# Patient Record
Sex: Female | Born: 1975 | Race: White | Hispanic: No | Marital: Married
Health system: Southern US, Community
[De-identification: ages and names within clinical notes are randomized; demographics above are authoritative.]

---

## 2018-04-01 ENCOUNTER — Emergency Department (HOSPITAL_COMMUNITY)
Admission: EM | Admit: 2018-04-01 | Discharge: 2018-04-01 | Disposition: A | Discharge status: Home / Self Care | Payer: 59 | Attending: Emergency Medicine | Admitting: Emergency Medicine

## 2018-04-01 ENCOUNTER — Emergency Department (HOSPITAL_COMMUNITY): Payer: 59

## 2018-04-01 ENCOUNTER — Encounter (HOSPITAL_COMMUNITY): Payer: Self-pay

## 2018-04-01 DIAGNOSIS — S82832A Other fracture of upper and lower end of left fibula, initial encounter for closed fracture: Secondary | ICD-10-CM | POA: Diagnosis not present

## 2018-04-01 DIAGNOSIS — Y9301 Activity, walking, marching and hiking: Secondary | ICD-10-CM | POA: Diagnosis not present

## 2018-04-01 DIAGNOSIS — W19XXXA Unspecified fall, initial encounter: Secondary | ICD-10-CM | POA: Diagnosis not present

## 2018-04-01 DIAGNOSIS — Y999 Unspecified external cause status: Secondary | ICD-10-CM | POA: Diagnosis not present

## 2018-04-01 DIAGNOSIS — Y929 Unspecified place or not applicable: Secondary | ICD-10-CM | POA: Insufficient documentation

## 2018-04-01 MED ORDER — HYDROCODONE-ACETAMINOPHEN 5-325 MG PO TABS
1.0000 | ORAL_TABLET | Freq: Once | ORAL | Status: AC
  Administered 2018-04-01: 1 via ORAL
  Filled 2018-04-01: qty 1

## 2018-04-01 MED ORDER — IBUPROFEN 800 MG PO TABS
800.0000 mg | ORAL_TABLET | Freq: Once | ORAL | Status: AC
  Administered 2018-04-01: 800 mg via ORAL
  Filled 2018-04-01: qty 1

## 2018-04-01 MED ORDER — HYDROCODONE-ACETAMINOPHEN 5-325 MG PO TABS: 1.0000 | ORAL_TABLET | ORAL | 0 refills | Status: AC | PRN

## 2018-04-01 NOTE — ED Provider Notes (Signed)
[facility] Provider Note   CSN: [id number] Arrival date & time: 04/01/18  [id number]     History   Chief Complaint Chief Complaint  Patient presents with  . Ankle Pain    HPI Emily Todd is a 43 y.o. female.  43 year old female presents with complaint of left ankle injury.  Patient states that she was walking out side today and slipped and fell.  Patient felt a pop in her left lateral ankle.  Pain in the ankle and the foot, no other injuries, complaints, concerns.     History reviewed. No pertinent past medical history.  There are no active problems to display for this patient.   History reviewed. No pertinent surgical history.   OB History   No obstetric history on file.      Home Medications    Prior to Admission medications   Medication Sig Start Date End Date Taking? Authorizing Provider  HYDROcodone-acetaminophen (NORCO/VICODIN) 5-325 MG tablet Take 1 tablet by mouth every 4 (four) hours as needed for up to 3 days for moderate pain. 04/01/18 04/04/18  Jeannie FendMurphy, Nil Bolser A, PA-C    Family History No family history on file.  Social History Social History   Tobacco Use  . Smoking status: Not on file  Substance Use Topics  . Alcohol use: Not on file  . Drug use: Not on file     Allergies   Patient has no known allergies.   Review of Systems Review of Systems  Constitutional: Negative for fever.  Musculoskeletal: Positive for arthralgias, gait problem and joint swelling.  Skin: Negative for color change, rash and wound.  Allergic/Immunologic: Negative for immunocompromised state.  Neurological: Negative for weakness and numbness.  Hematological: Does not bruise/bleed easily.  Psychiatric/Behavioral: Negative for confusion.  All other systems reviewed and are negative.    Physical Exam Updated Vital Signs BP (!) 150/101 (BP Location: Left Arm)   Pulse 89   Temp 98.2 F (36.8 C) (Oral)   Resp 20   SpO2 98%    Physical Exam Vitals signs and nursing note reviewed.  Constitutional:      General: She is not in acute distress.    Appearance: She is well-developed. She is not diaphoretic.  HENT:     Head: Normocephalic and atraumatic.  Cardiovascular:     Pulses: Normal pulses.  Pulmonary:     Effort: Pulmonary effort is normal.  Musculoskeletal:        General: Swelling, tenderness and signs of injury present.     Left knee: Normal.     Left ankle: She exhibits decreased range of motion and swelling. She exhibits no ecchymosis, no deformity, no laceration and normal pulse. Tenderness. Lateral malleolus and medial malleolus tenderness found. No head of 5th metatarsal and no proximal fibula tenderness found.     Left foot: Decreased range of motion. Normal capillary refill. Tenderness and bony tenderness present. No swelling, crepitus or deformity.       Feet:  Skin:    General: Skin is warm and dry.     Capillary Refill: Capillary refill takes less than 2 seconds.     Findings: No erythema or rash.  Neurological:     Mental Status: She is alert and oriented to person, place, and time.  Psychiatric:        Behavior: Behavior normal.      ED Treatments / Results  Labs (all labs ordered are listed, but only abnormal results are displayed) Labs Reviewed -  No data to display  EKG None  Radiology Dg Ankle Complete Left  Result Date: 04/01/2018 CLINICAL DATA:  Left ankle pain and swelling after fall today. EXAM: LEFT ANKLE COMPLETE - 3+ VIEW COMPARISON:  None. FINDINGS: Mildly displaced fracture is seen involving the distal left fibula with overlying soft tissue swelling. Joint spaces are intact. No other bony abnormality is noted. IMPRESSION: Mildly displaced distal left fibular fracture. Electronically Signed   By: Lupita Raider, M.D.   On: 04/01/2018 09:24   Dg Foot Complete Left  Result Date: 04/01/2018 CLINICAL DATA:  Left foot pain after fall today. EXAM: LEFT FOOT - COMPLETE  3+ VIEW COMPARISON:  None. FINDINGS: Mildly displaced distal fibular fracture is noted. No other fracture or bony abnormality is noted. Joint spaces are intact. No soft tissue abnormality is noted. IMPRESSION: Mildly displaced distal left fibular fracture. Electronically Signed   By: Lupita Raider, M.D.   On: 04/01/2018 09:23    Procedures Procedures (including critical care time)  Medications Ordered in ED Medications  ibuprofen (ADVIL,MOTRIN) tablet 800 mg (800 mg Oral Given 04/01/18 0857)  HYDROcodone-acetaminophen (NORCO/VICODIN) 5-325 MG per tablet 1 tablet (1 tablet Oral Given 04/01/18 0936)     Initial Impression / Assessment and Plan / ED Course  I have reviewed the triage vital signs and the nursing notes.  Pertinent labs & imaging results that were available during my care of the patient were reviewed by me and considered in my medical decision making (see chart for details).  Clinical Course as of Apr 01 1028  Sat Apr 01, 2018  [id number] 43 year old female presents with left ankle injury after a fall today.  On exam patient has swelling to the left ankle, laterally more so than medially with tenderness.  Also tenderness along the left first metatarsal.  X-ray is positive for a distal left fibula fracture, x-ray foot unremarkable.  Patient was placed in a cam walker given crutches to weight-bear as tolerated.  Given prescription for Norco and advised to follow-up with orthopedics.  Patient does not live locally, she was given a CD with her x-rays on it to take to orthopedic follow-up.  Patient plans to contact orthopedics on Monday upon arrival back home in [place].   [LM]    Clinical Course User Index [LM] Jeannie Fend, PA-C   Final Clinical Impressions(s) / ED Diagnoses   Final diagnoses:  Closed fracture of distal end of left fibula, unspecified fracture morphology, initial encounter    ED Discharge Orders         Ordered    HYDROcodone-acetaminophen (NORCO/VICODIN) 5-325 MG  tablet  Every 4 hours PRN     04/01/18 0939           Jeannie Fend, PA-C 04/01/18 1030    Alvira Monday, MD 04/03/18 1606

## 2018-04-01 NOTE — ED Triage Notes (Signed)
Pt presents for evaluation of L ankle pain. Pt tripped this morning while walking out of hotel. Unable to bear weight. Swelling to lateral aspect of ankle.

## 2018-04-01 NOTE — Discharge Instructions (Addendum)
Home to rest, apply ice for 20 minutes at a time and elevate to help with pain and swelling. Use crutches as needed, weight-bear as tolerated. Take Norco as needed as prescribed for pain.

## 2018-04-01 NOTE — ED Notes (Signed)
Patient requesting images to be placed on a disk for her to take home. Xray notified and will put the images on a disk for the patient and bring it to her.

## 2018-04-01 NOTE — ED Notes (Signed)
Patient transported to X-ray 

## 2018-04-01 NOTE — Progress Notes (Signed)
Orthopedic Tech Progress Note Patient Details:  Emily Todd 19-Mar-1976 [id number]  Ortho Devices Type of Ortho Device: Crutches, CAM walker Ortho Device/Splint Interventions: Application   Post Interventions Patient Tolerated: Well Instructions Provided: Care of device, Adjustment of device   Saul Fordyce 04/01/2018, 10:11 AM

## 2020-03-25 IMAGING — DX DG ANKLE COMPLETE 3+V*L*
3 series · 3 of 3 positions shown · non-contrast
Comparison: None.

CLINICAL DATA: Left ankle pain and swelling after fall today.

EXAM:
LEFT ANKLE COMPLETE - 3+ VIEW

[ankle ap]
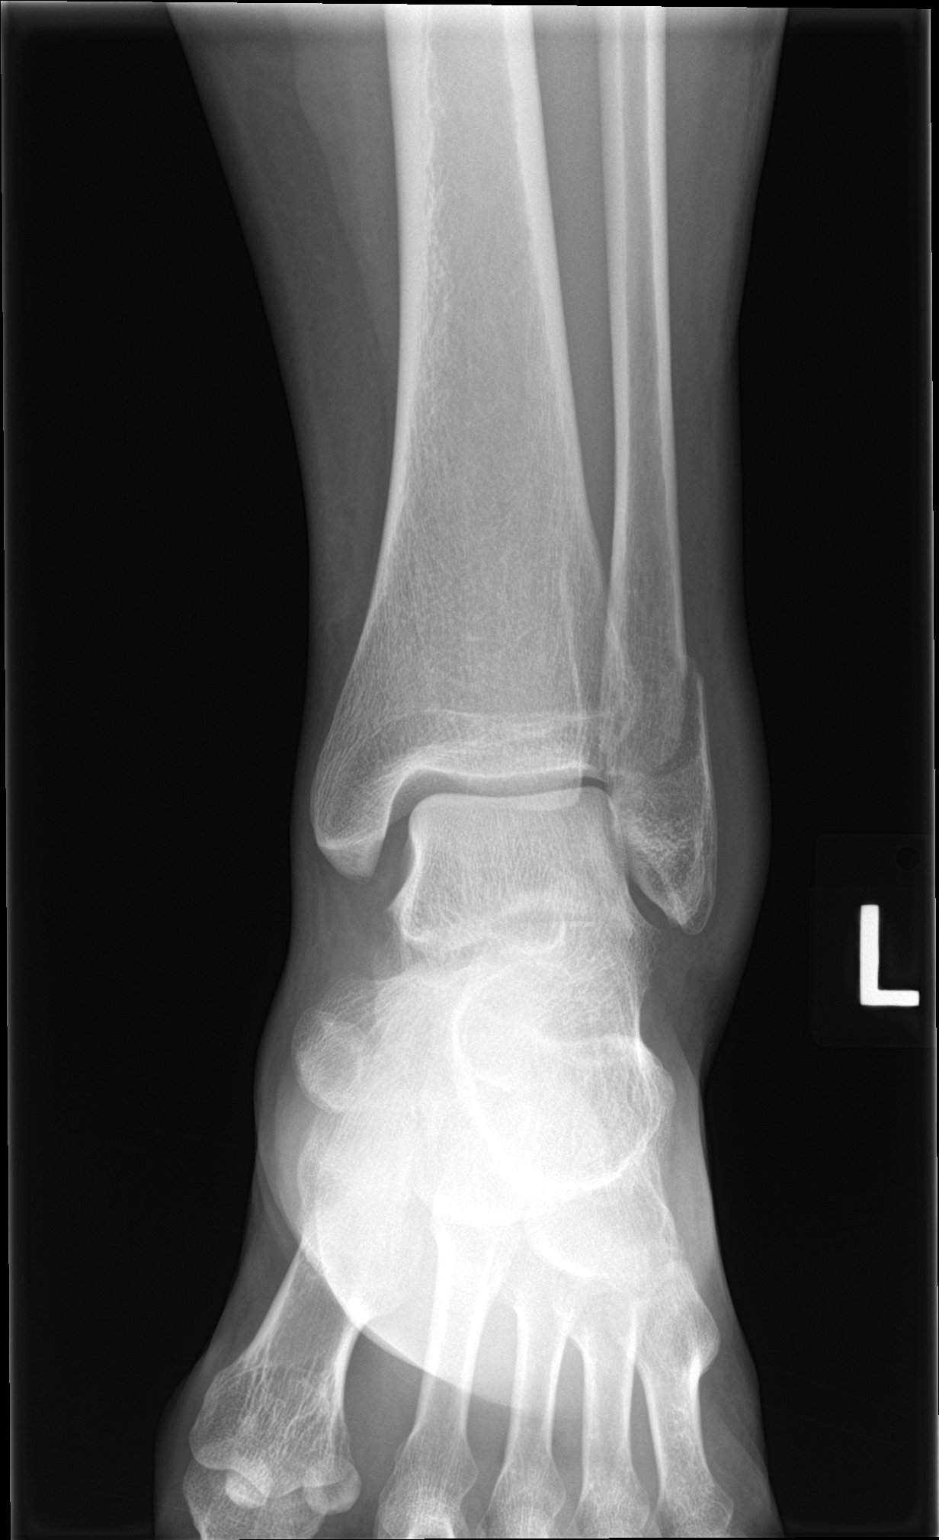

[ankle obl]
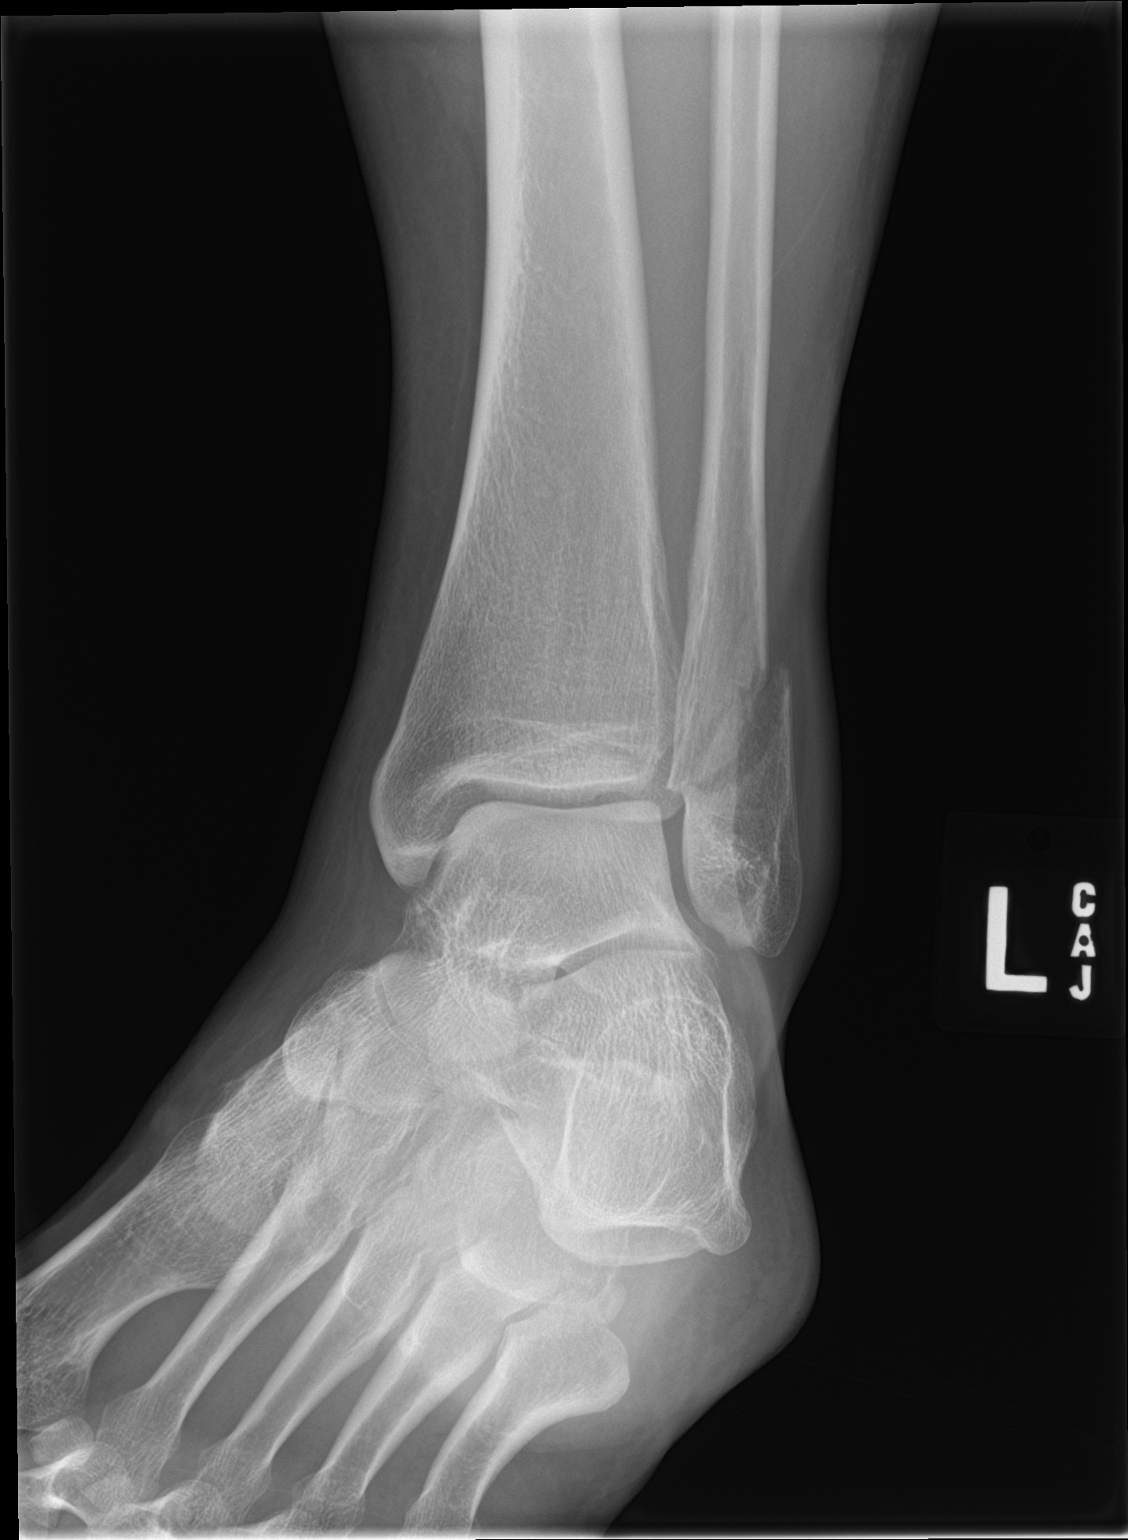

[ankle lat]
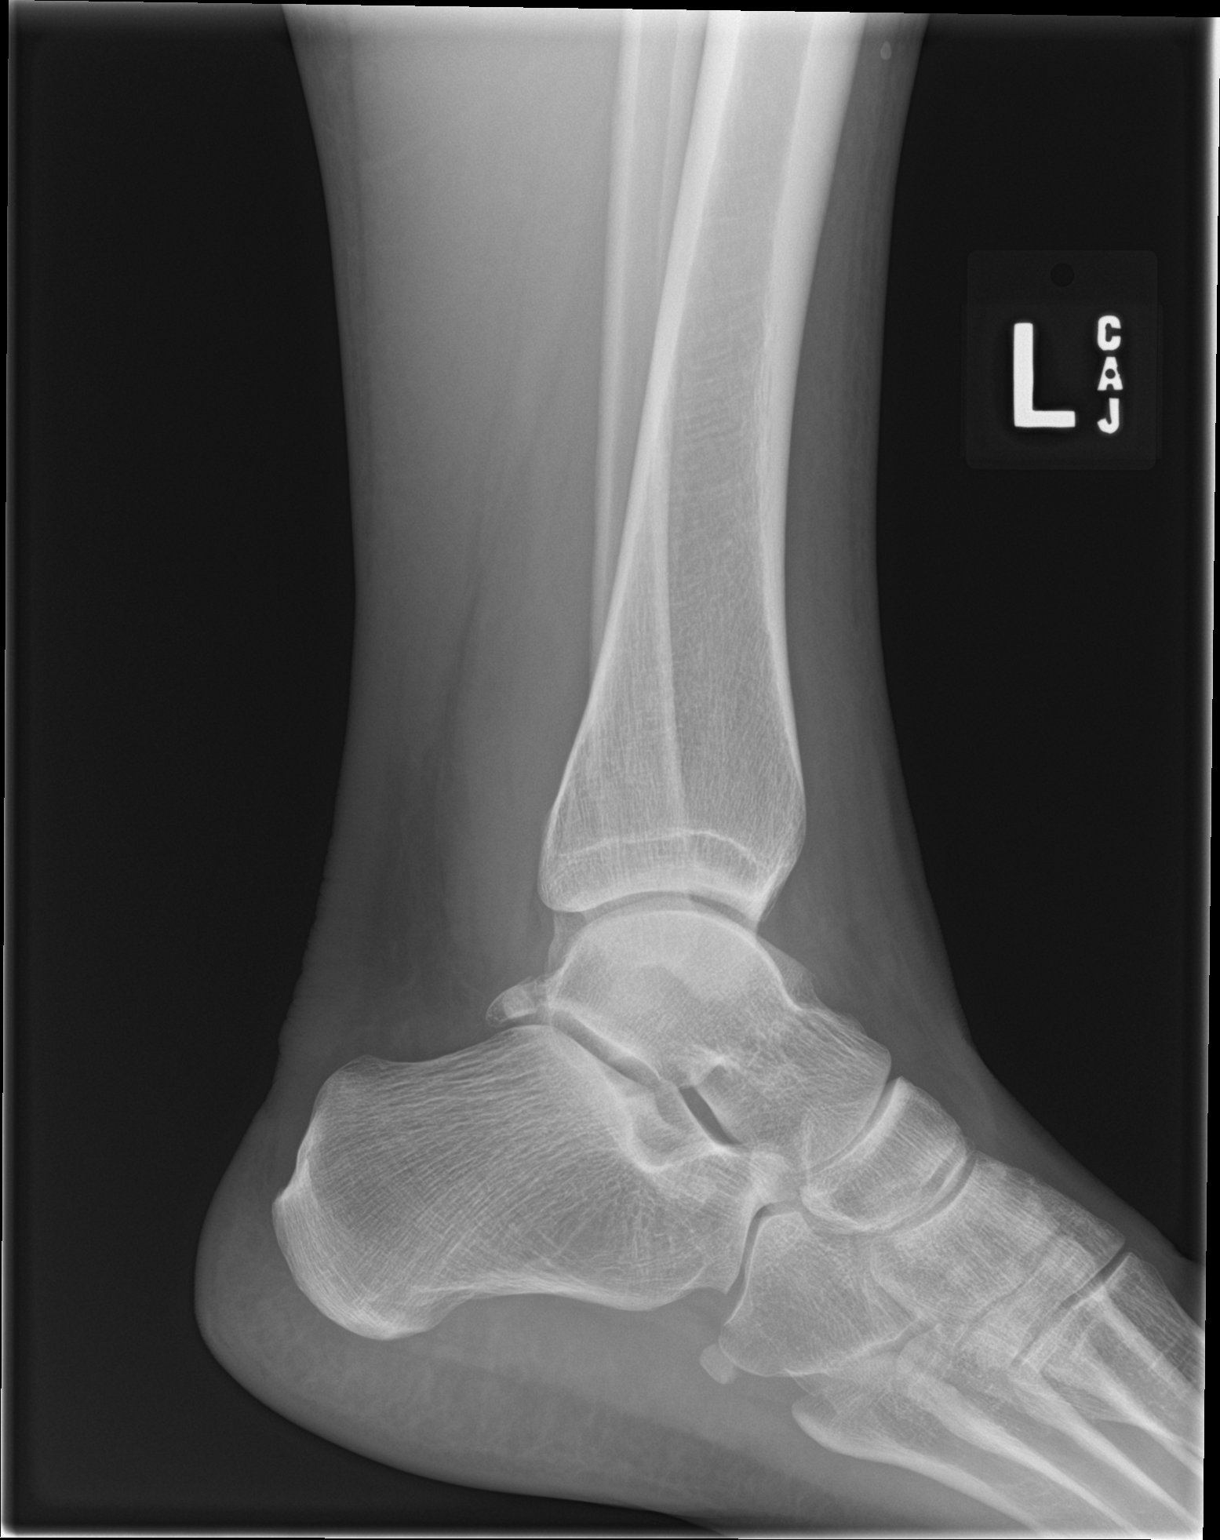

[3 of 3 positions shown; findings below may reference images not displayed]

FINDINGS: Mildly displaced fracture is seen involving the distal left fibula
with overlying soft tissue swelling. Joint spaces are intact. No
other bony abnormality is noted.
IMPRESSION: Mildly displaced distal left fibular fracture.

## 2020-03-25 IMAGING — DX DG FOOT COMPLETE 3+V*L*
3 series · 3 of 3 positions shown · non-contrast
Comparison: None.

CLINICAL DATA: Left foot pain after fall today.

EXAM:
LEFT FOOT - COMPLETE 3+ VIEW

[foot ap]
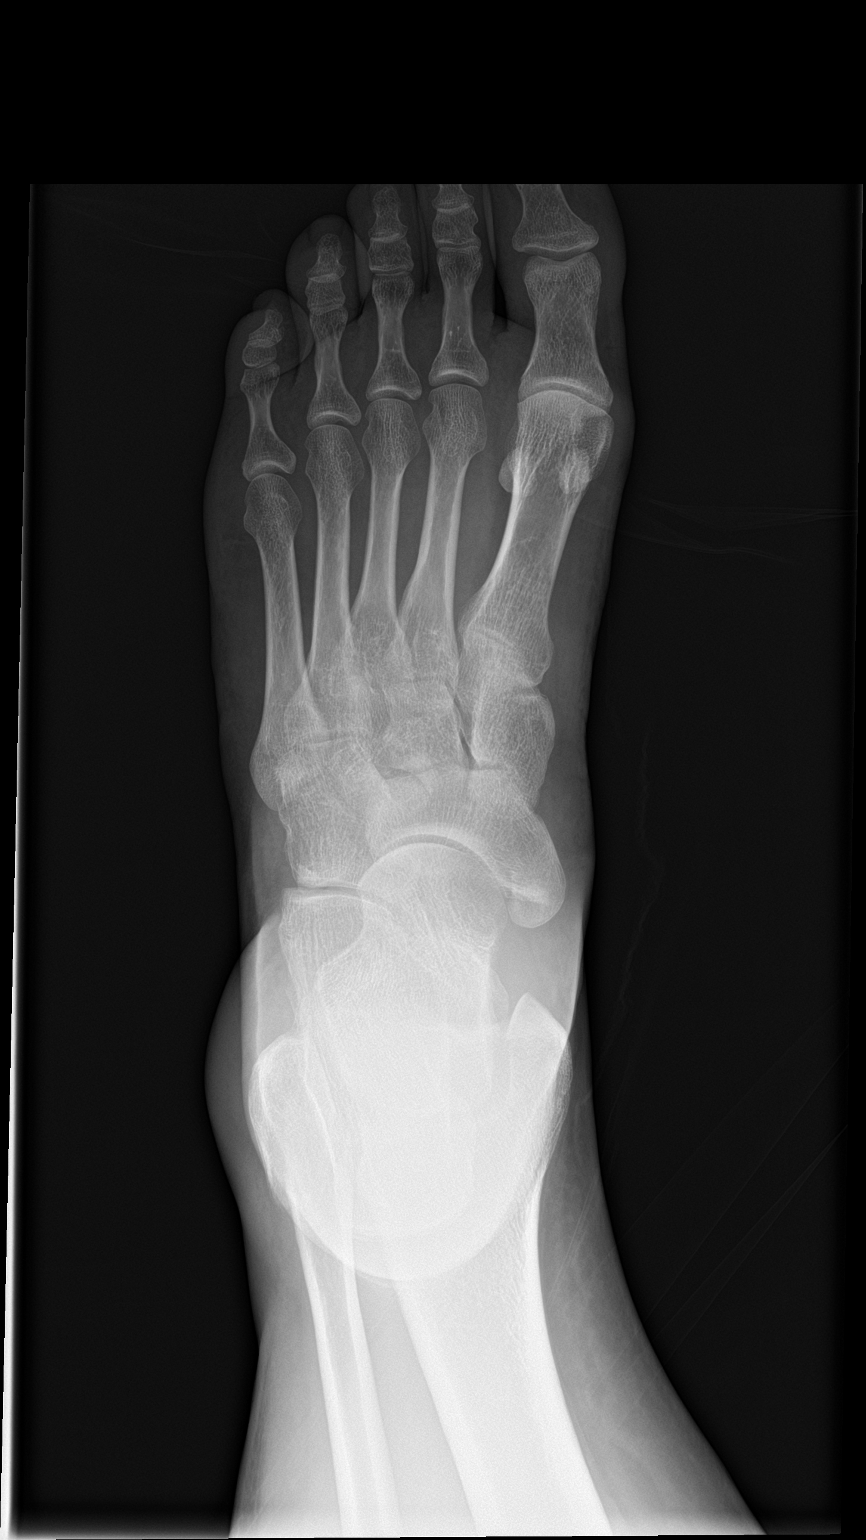

[foot obl]
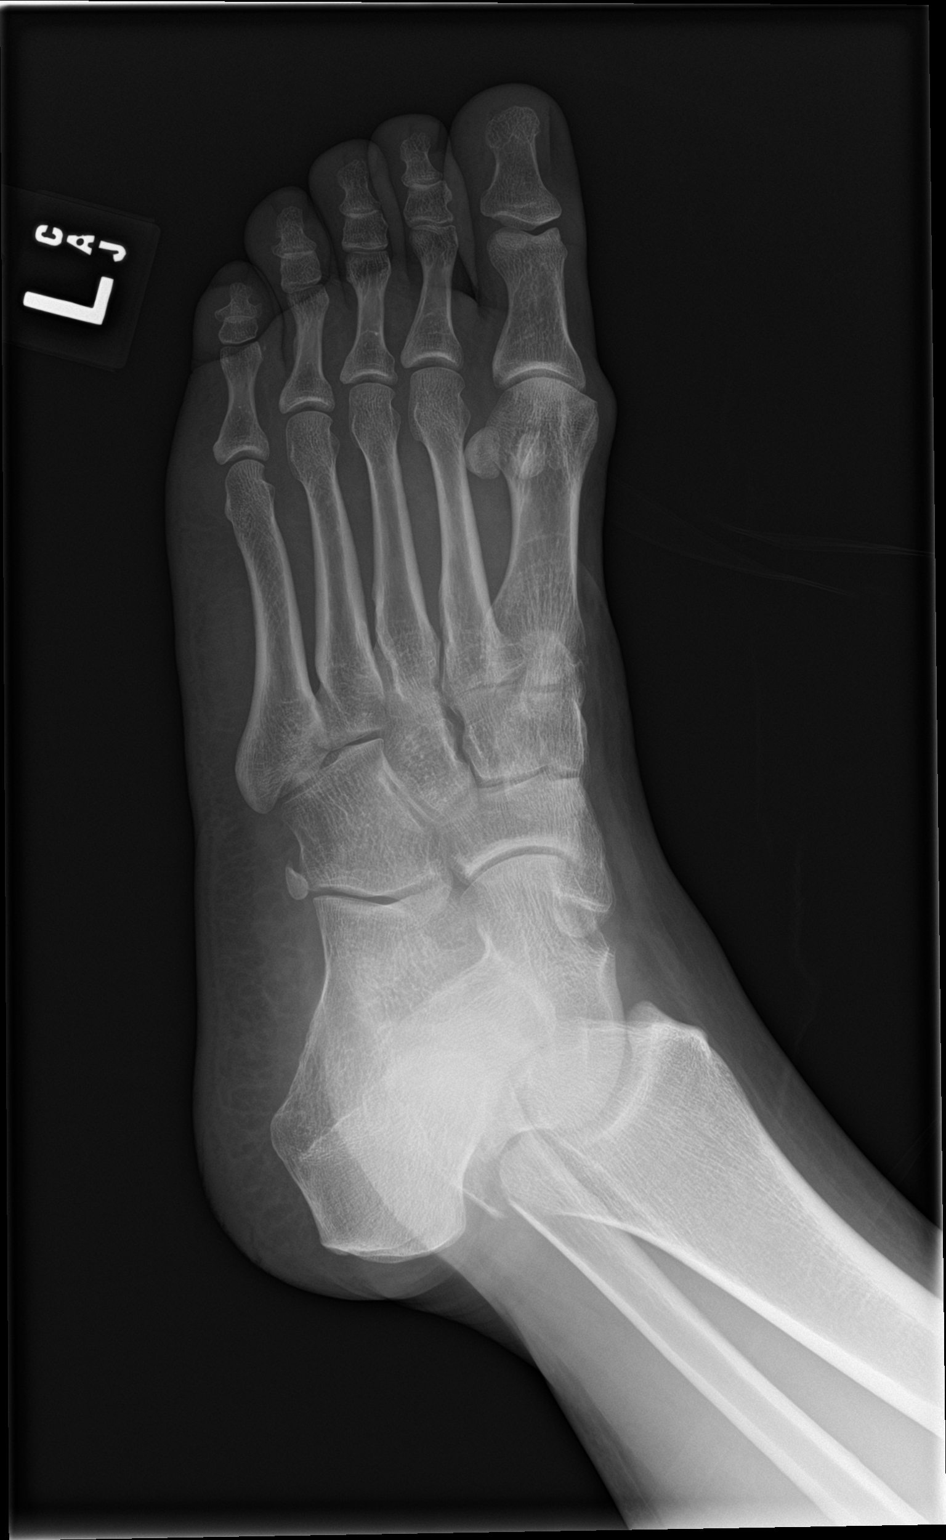

[foot lat]
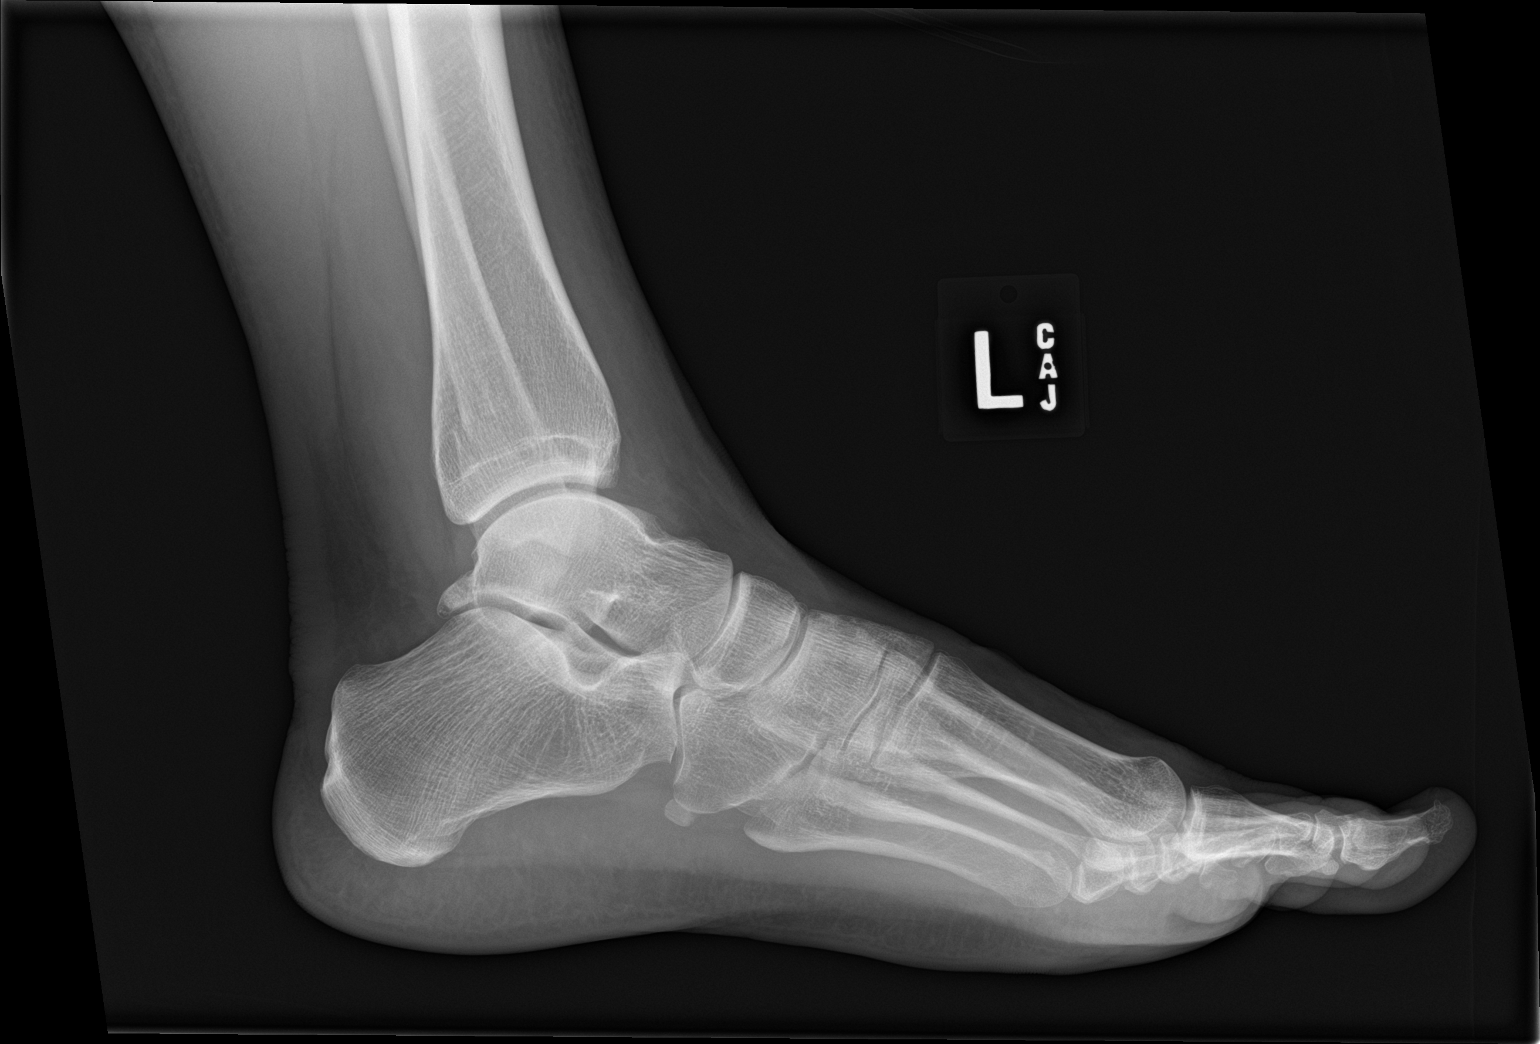

[3 of 3 positions shown; findings below may reference images not displayed]

FINDINGS: Mildly displaced distal fibular fracture is noted. No other fracture
or bony abnormality is noted. Joint spaces are intact. No soft
tissue abnormality is noted.
IMPRESSION: Mildly displaced distal left fibular fracture.
